# Patient Record
Sex: Male | Born: 1995 | Race: Black or African American | Hispanic: No | Marital: Single | Smoking: Never smoker
Health system: Southern US, Community
[De-identification: ages and names within clinical notes are randomized; demographics above are authoritative.]

---
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
## 2017-11-05 ENCOUNTER — Encounter (HOSPITAL_COMMUNITY): Payer: Self-pay

## 2017-11-05 ENCOUNTER — Other Ambulatory Visit: Payer: Self-pay

## 2017-11-05 DIAGNOSIS — R0989 Other specified symptoms and signs involving the circulatory and respiratory systems: Secondary | ICD-10-CM | POA: Insufficient documentation

## 2017-11-05 NOTE — ED Triage Notes (Signed)
Patient states he was eating fish last night and swallowed a bone. States "I thought it would eventually go down but the pain started to get worse throughout the day. It's like something is stabbing my throat". Denies trouble breathing or swallowing.

## 2017-11-06 ENCOUNTER — Emergency Department (HOSPITAL_COMMUNITY)
Admission: EM | Admit: 2017-11-06 | Discharge: 2017-11-06 | Disposition: A | Discharge status: Home / Self Care | Payer: Medicaid Other | Attending: Emergency Medicine | Admitting: Emergency Medicine

## 2017-11-06 ENCOUNTER — Emergency Department (HOSPITAL_COMMUNITY): Payer: Medicaid Other

## 2017-11-06 DIAGNOSIS — R0989 Other specified symptoms and signs involving the circulatory and respiratory systems: Secondary | ICD-10-CM

## 2017-11-06 MED ORDER — OXYCODONE-ACETAMINOPHEN 5-325 MG PO TABS
1.0000 | ORAL_TABLET | Freq: Once | ORAL | Status: AC
  Administered 2017-11-06: 1 via ORAL
  Filled 2017-11-06: qty 1

## 2017-11-06 MED ORDER — ACETAMINOPHEN 325 MG PO TABS
325.0000 mg | ORAL_TABLET | Freq: Once | ORAL | Status: AC
  Administered 2017-11-06: 325 mg via ORAL
  Filled 2017-11-06: qty 1

## 2017-11-06 NOTE — ED Provider Notes (Signed)
[facility] EMERGENCY DEPARTMENT Provider Note   CSN: [id number] Arrival date & time: 11/05/17  2132     History   Chief Complaint Chief Complaint  Patient presents with  . Foreign Body    Fish bone in throat    HPI Mark Massey is a 22 y.o. male.  HPI   22 year old male presenting for evaluation of throat irritation.  Patient report eating fish yesterday afternoon and accidentally swallowed a fish bone.  Since then he has expressing pain to the back of his throat.  Pain is sharp, non radiating, mild to moderate and improving.  Pain present with swallowing.  No nausea, no vomiting, no hematemesis, hemoptysis, coughing, chest pain or abdominal pain.  No other complaint.  History reviewed. No pertinent past medical history.  There are no active problems to display for this patient.   History reviewed. No pertinent surgical history.      Home Medications    Prior to Admission medications   Not on File    Family History No family history on file.  Social History Social History   Tobacco Use  . Smoking status: Never Smoker  . Smokeless tobacco: Never Used  Substance Use Topics  . Alcohol use: Not Currently  . Drug use: Not Currently     Allergies   Patient has no allergy information on record.   Review of Systems Review of Systems  Constitutional: Negative for fever.  HENT: Positive for sore throat. Negative for dental problem, trouble swallowing and voice change.   Respiratory: Negative for shortness of breath.   Cardiovascular: Negative for chest pain.  Gastrointestinal: Negative for abdominal pain.     Physical Exam Updated Vital Signs BP 119/61 (BP Location: Right Arm)   Pulse 84   Temp (!) 100.5 F (38.1 C) (Oral)   Resp 16   Ht 6' (1.829 m)   SpO2 99%   Physical Exam  Constitutional: He appears well-developed and well-nourished. No distress.  HENT:  Head: Atraumatic.  Mouth/Throat: Oropharynx is clear and moist.  No oropharyngeal exudate.  Eyes: Conjunctivae are normal.  Neck: Normal range of motion. Neck supple. No tracheal deviation present. No thyromegaly present.  Cardiovascular: Normal rate and regular rhythm.  Pulmonary/Chest: Effort normal and breath sounds normal.  Abdominal: Soft. Bowel sounds are normal. He exhibits no distension. There is no tenderness.  Lymphadenopathy:    He has no cervical adenopathy.  Neurological: He is alert.  Skin: No rash noted.  Psychiatric: He has a normal mood and affect.  Nursing note and vitals reviewed.    ED Treatments / Results  Labs (all labs ordered are listed, but only abnormal results are displayed) Labs Reviewed - No data to display  EKG None  Radiology Dg Neck Soft Tissue  Result Date: 11/06/2017 CLINICAL DATA:  Evaluate for fish bone stuck in neck. EXAM: NECK SOFT TISSUES - 1+ VIEW COMPARISON:  None FINDINGS: There is no evidence of retropharyngeal soft tissue swelling or epiglottic enlargement. The cervical airway is unremarkable and no radio-opaque foreign body identified. IMPRESSION: Negative. Electronically Signed   By: Signa Kell M.D.   On: 11/06/2017 07:43    Procedures Procedures (including critical care time)  Medications Ordered in ED Medications  acetaminophen (TYLENOL) tablet 325 mg (325 mg Oral Given 11/06/17 7829)  oxyCODONE-acetaminophen (PERCOCET/ROXICET) 5-325 MG per tablet 1 tablet (1 tablet Oral Given 11/06/17 5621)     Initial Impression / Assessment and Plan / ED Course  I have reviewed the  triage vital signs and the nursing notes.  Pertinent labs & imaging results that were available during my care of the patient were reviewed by me and considered in my medical decision making (see chart for details).     BP 119/61 (BP Location: Right Arm)   Pulse 84   Temp (!) 100.5 F (38.1 C) (Oral)   Resp 16   Ht 6' (1.829 m)   SpO2 99%    Final Clinical Impressions(s) / ED Diagnoses   Final diagnoses:    Sensation of foreign body in throat    ED Discharge Orders    None     Pt report throat irritation after he ate fish yesterday afternoon.  Was concern of a fish bone being stuck.  Xray today unremarkable.  Pt is well appearing. No evidence of perforation or other concerning features.  He has low grade fever of 100.5 however denies any sick sxs.  Throat exam normal, doubt strep infection.  Reassurance given, return precaution given    Fayrene Helperran, Kaylanni Ezelle, PA-C 11/06/17 [id number]    Alvira MondaySchlossman, Erin, MD 11/08/17 [phone]

## 2017-11-06 NOTE — Discharge Instructions (Signed)
Please return if you develop abdominal pain, vomit blood or if you have other concerns.

## 2018-10-17 IMAGING — CR DG NECK SOFT TISSUE
2 series · 2 of 2 positions shown · non-contrast
Comparison: None

CLINICAL DATA: Evaluate for fish bone stuck in neck.

EXAM:
NECK SOFT TISSUES - 1+ VIEW

[neck lat]
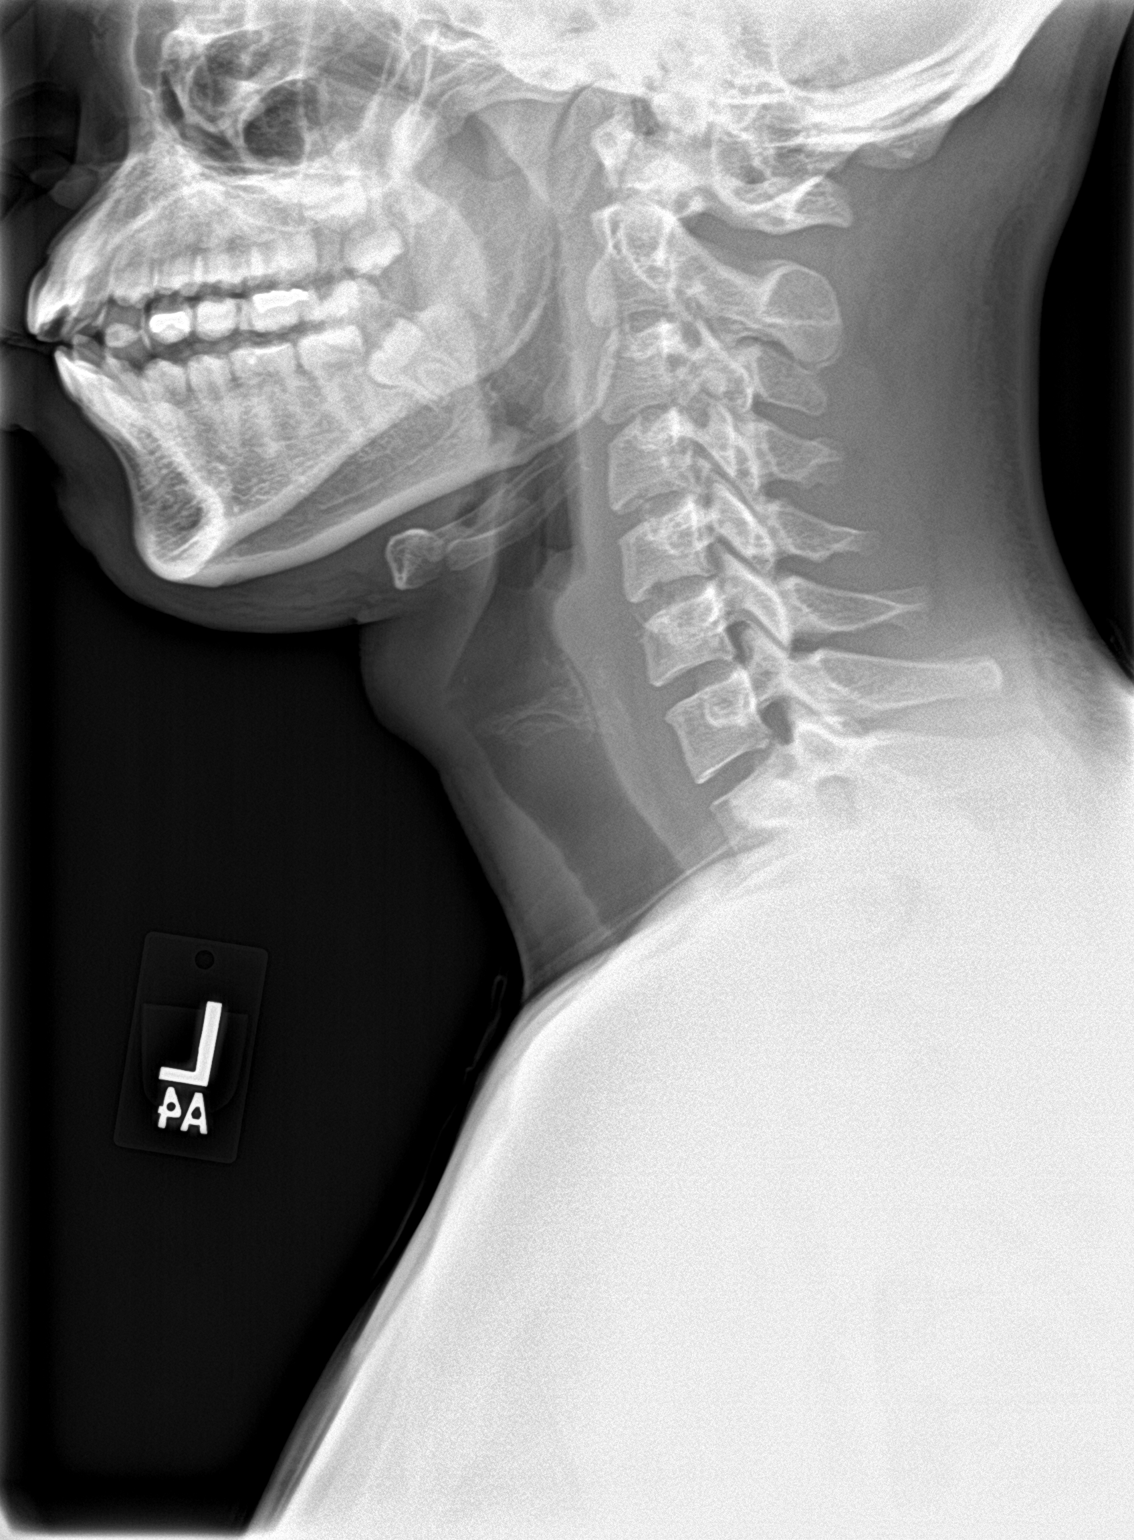

[neck ap]
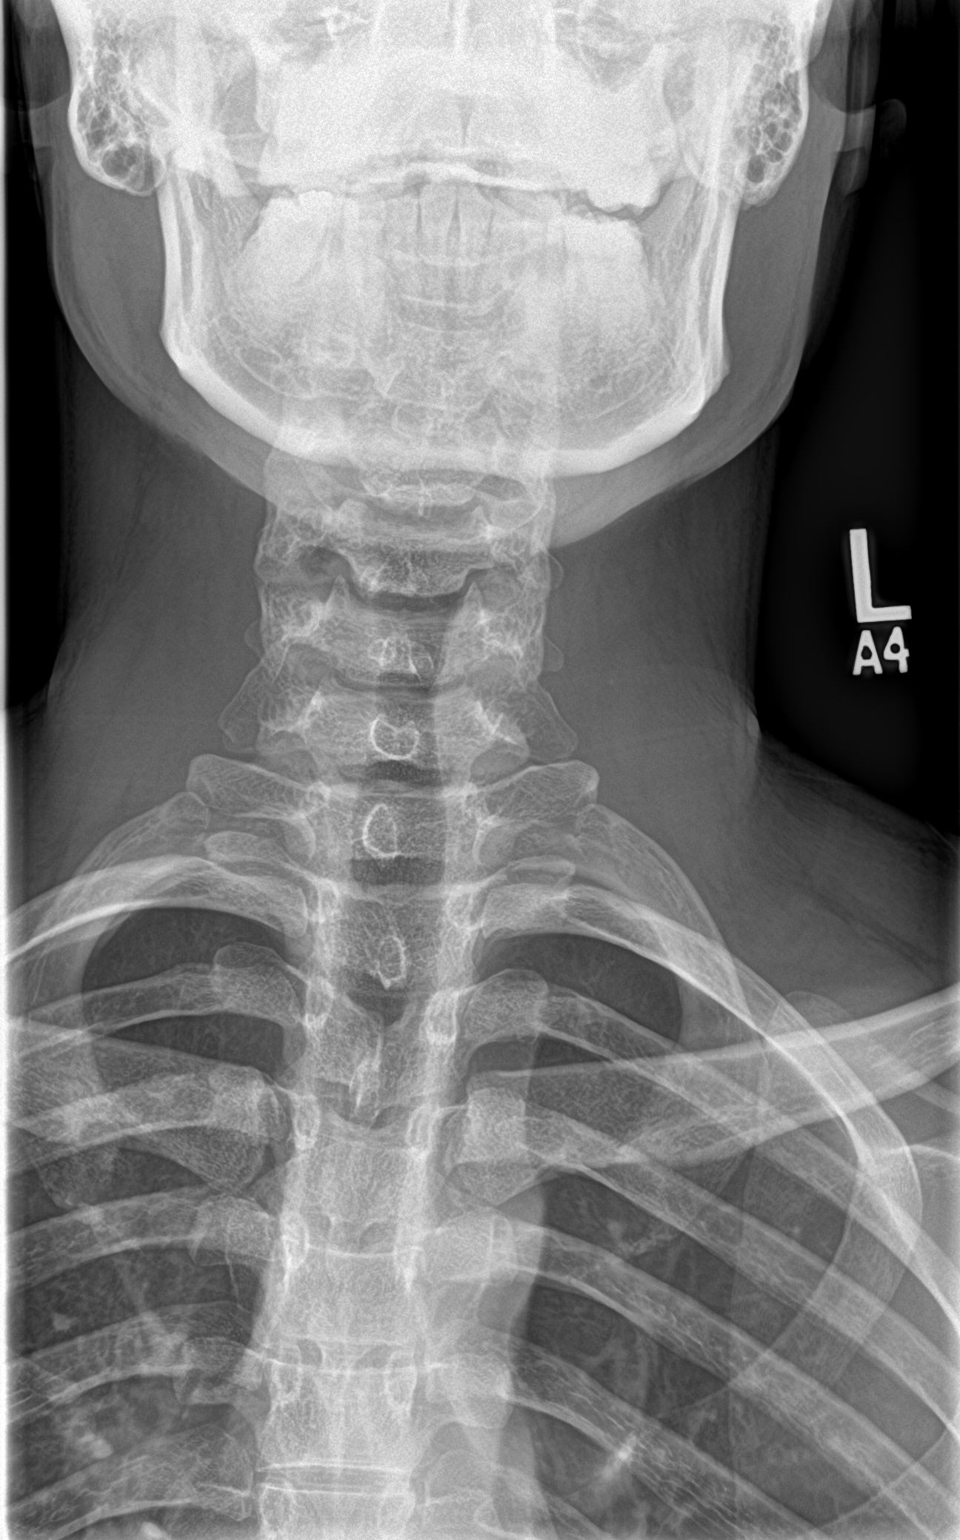

[2 of 2 positions shown; findings below may reference images not displayed]

FINDINGS: There is no evidence of retropharyngeal soft tissue swelling or
epiglottic enlargement. The cervical airway is unremarkable and no
radio-opaque foreign body identified.
IMPRESSION: Negative.
